# Patient Record
Sex: Female | Born: 1995 | Race: White | Hispanic: No | Marital: Single | Smoking: Current every day smoker
Health system: Southern US, Community
[De-identification: ages and names within clinical notes are randomized; demographics above are authoritative.]

## PROBLEM LIST (undated history)

## (undated) DIAGNOSIS — J45909 Unspecified asthma, uncomplicated: Secondary | ICD-10-CM

## (undated) DIAGNOSIS — B009 Herpesviral infection, unspecified: Secondary | ICD-10-CM

## (undated) HISTORY — DX: Herpesviral infection, unspecified: B00.9

---
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
## 2013-09-28 ENCOUNTER — Emergency Department: Payer: Self-pay | Admitting: Emergency Medicine

## 2013-09-28 LAB — URINALYSIS, COMPLETE
Bilirubin,UR: NEGATIVE
Blood: NEGATIVE
GLUCOSE, UR: NEGATIVE mg/dL (ref 0–75)
Nitrite: NEGATIVE
PH: 6 (ref 4.5–8.0)
Protein: NEGATIVE
Specific Gravity: 1.023 (ref 1.003–1.030)
Squamous Epithelial: 17
WBC UR: 4 /HPF (ref 0–5)

## 2013-09-28 LAB — COMPREHENSIVE METABOLIC PANEL
ALBUMIN: 4.1 g/dL (ref 3.4–5.0)
ALK PHOS: 62 U/L
Anion Gap: 4 — ABNORMAL LOW (ref 7–16)
BUN: 9 mg/dL (ref 7–18)
Bilirubin,Total: 0.6 mg/dL (ref 0.2–1.0)
CREATININE: 0.73 mg/dL (ref 0.60–1.30)
Calcium, Total: 9.5 mg/dL (ref 8.5–10.1)
Chloride: 106 mmol/L (ref 98–107)
Co2: 27 mmol/L (ref 21–32)
EGFR (African American): >60
Glucose: 92 mg/dL (ref 65–99)
OSMOLALITY: 272 (ref 275–301)
Potassium: 3.7 mmol/L (ref 3.5–5.1)
SGOT(AST): 8 U/L — ABNORMAL LOW (ref 15–37)
SGPT (ALT): 12 U/L — ABNORMAL LOW
Sodium: 137 mmol/L (ref 136–145)
TOTAL PROTEIN: 7.2 g/dL (ref 6.4–8.2)

## 2013-09-28 LAB — CBC WITH DIFFERENTIAL/PLATELET
Basophil #: 0.1 10*3/uL (ref 0.0–0.1)
Basophil %: 1.2 %
EOS ABS: 0.9 10*3/uL — AB (ref 0.0–0.7)
Eosinophil %: 10.7 %
HCT: 41.6 % (ref 35.0–47.0)
HGB: 13.5 g/dL (ref 12.0–16.0)
Lymphocyte #: 3 10*3/uL (ref 1.0–3.6)
Lymphocyte %: 35.4 %
MCH: 29.4 pg (ref 26.0–34.0)
MCHC: 32.5 g/dL (ref 32.0–36.0)
MCV: 91 fL (ref 80–100)
MONOS PCT: 6.9 %
Monocyte #: 0.6 x10 3/mm (ref 0.2–0.9)
Neutrophil #: 3.9 10*3/uL (ref 1.4–6.5)
Neutrophil %: 45.8 %
PLATELETS: 226 10*3/uL (ref 150–440)
RBC: 4.59 10*6/uL (ref 3.80–5.20)
RDW: 14.1 % (ref 11.5–14.5)
WBC: 8.5 10*3/uL (ref 3.6–11.0)

## 2013-09-28 LAB — LIPASE, BLOOD: LIPASE: 78 U/L (ref 73–393)

## 2013-09-29 ENCOUNTER — Encounter (HOSPITAL_COMMUNITY): Payer: Self-pay | Admitting: Emergency Medicine

## 2013-09-29 ENCOUNTER — Emergency Department (HOSPITAL_COMMUNITY)
Admission: EM | Admit: 2013-09-29 | Discharge: 2013-09-30 | Disposition: A | Discharge status: Home / Self Care | Payer: Self-pay | Attending: Emergency Medicine | Admitting: Emergency Medicine

## 2013-09-29 DIAGNOSIS — Z3202 Encounter for pregnancy test, result negative: Secondary | ICD-10-CM | POA: Insufficient documentation

## 2013-09-29 DIAGNOSIS — R1013 Epigastric pain: Secondary | ICD-10-CM | POA: Insufficient documentation

## 2013-09-29 DIAGNOSIS — R630 Anorexia: Secondary | ICD-10-CM | POA: Insufficient documentation

## 2013-09-29 LAB — CBC WITH DIFFERENTIAL/PLATELET
BASOS ABS: 0.1 10*3/uL (ref 0.0–0.1)
Basophils Relative: 1 % (ref 0–1)
Eosinophils Absolute: 0.7 10*3/uL (ref 0.0–0.7)
Eosinophils Relative: 6 % — ABNORMAL HIGH (ref 0–5)
HCT: 42.5 % (ref 36.0–46.0)
Hemoglobin: 14.1 g/dL (ref 12.0–15.0)
LYMPHS ABS: 4.2 10*3/uL — AB (ref 0.7–4.0)
LYMPHS PCT: 40 % (ref 12–46)
MCH: 29.2 pg (ref 26.0–34.0)
MCHC: 33.2 g/dL (ref 30.0–36.0)
MCV: 88 fL (ref 78.0–100.0)
Monocytes Absolute: 0.9 10*3/uL (ref 0.1–1.0)
Monocytes Relative: 9 % (ref 3–12)
NEUTROS ABS: 4.7 10*3/uL (ref 1.7–7.7)
NEUTROS PCT: 44 % (ref 43–77)
PLATELETS: 269 10*3/uL (ref 150–400)
RBC: 4.83 MIL/uL (ref 3.87–5.11)
RDW: 13.4 % (ref 11.5–15.5)
WBC: 10.5 10*3/uL (ref 4.0–10.5)

## 2013-09-29 LAB — URINALYSIS, ROUTINE W REFLEX MICROSCOPIC
BILIRUBIN URINE: NEGATIVE
Glucose, UA: NEGATIVE mg/dL
Hgb urine dipstick: NEGATIVE
KETONES UR: 15 mg/dL — AB
Nitrite: NEGATIVE
PH: 6 (ref 5.0–8.0)
Protein, ur: NEGATIVE mg/dL
SPECIFIC GRAVITY, URINE: 1.028 (ref 1.005–1.030)
Urobilinogen, UA: 0.2 mg/dL (ref 0.0–1.0)

## 2013-09-29 LAB — URINE MICROSCOPIC-ADD ON

## 2013-09-29 LAB — COMPREHENSIVE METABOLIC PANEL
ALK PHOS: 68 U/L (ref 39–117)
ALT: 8 U/L (ref 0–35)
AST: 14 U/L (ref 0–37)
Albumin: 4.8 g/dL (ref 3.5–5.2)
Anion gap: 15 (ref 5–15)
BUN: 7 mg/dL (ref 6–23)
CHLORIDE: 100 meq/L (ref 96–112)
CO2: 24 meq/L (ref 19–32)
Calcium: 9.9 mg/dL (ref 8.4–10.5)
Creatinine, Ser: 0.6 mg/dL (ref 0.50–1.10)
GLUCOSE: 94 mg/dL (ref 70–99)
POTASSIUM: 4.4 meq/L (ref 3.7–5.3)
Sodium: 139 mEq/L (ref 137–147)
Total Bilirubin: 0.2 mg/dL — ABNORMAL LOW (ref 0.3–1.2)
Total Protein: 7.6 g/dL (ref 6.0–8.3)

## 2013-09-29 LAB — POC URINE PREG, ED: Preg Test, Ur: NEGATIVE

## 2013-09-29 LAB — LIPASE, BLOOD: Lipase: 21 U/L (ref 11–59)

## 2013-09-29 MED ORDER — GI COCKTAIL ~~LOC~~
30.0000 mL | Freq: Once | ORAL | Status: AC
  Administered 2013-09-30: 30 mL via ORAL
  Filled 2013-09-29: qty 30

## 2013-09-29 MED ORDER — HYDROCODONE-ACETAMINOPHEN 5-325 MG PO TABS
1.0000 | ORAL_TABLET | Freq: Once | ORAL | Status: AC
  Administered 2013-09-30: 1 via ORAL
  Filled 2013-09-29: qty 1

## 2013-09-29 NOTE — ED Provider Notes (Addendum)
CSN: [id number]     Arrival date & time 09/29/13  2106 History   First MD Initiated Contact with Patient 09/29/13 2306     Chief Complaint  Patient presents with  . Abdominal Pain     (Consider location/radiation/quality/duration/timing/severity/associated sxs/prior Treatment) Patient is a 18 y.o. female presenting with abdominal pain. The history is provided by the patient.  Abdominal Pain Pain location:  Epigastric Pain quality: aching   Pain radiates to:  Does not radiate Pain severity:  Severe Onset quality:  Gradual Duration:  2 days Timing:  Constant Progression:  Unchanged Chronicity:  New Context: not alcohol use, not eating, not previous surgeries, not retching, not suspicious food intake and not trauma   Relieved by:  OTC medications Associated symptoms: anorexia   Associated symptoms: no chest pain, no constipation, no cough, no diarrhea, no hematemesis, no hematochezia, no hematuria, no nausea, no shortness of breath and no vomiting     History reviewed. No pertinent past medical history. History reviewed. No pertinent past surgical history. History reviewed. No pertinent family history. History  Substance Use Topics  . Smoking status: Never Smoker   . Smokeless tobacco: Not on file  . Alcohol Use: No   OB History   Grav Para Term Preterm Abortions TAB SAB Ect Mult Living       0   0  1     Review of Systems  Constitutional: Negative for activity change.  HENT: Negative for facial swelling.   Respiratory: Negative for cough, shortness of breath and wheezing.   Cardiovascular: Negative for chest pain.  Gastrointestinal: Positive for abdominal pain and anorexia. Negative for nausea, vomiting, diarrhea, constipation, blood in stool, hematochezia, abdominal distention and hematemesis.  Genitourinary: Negative for hematuria and difficulty urinating.  Musculoskeletal: Negative for neck pain.  Skin: Negative for color change.  Neurological: Negative for speech  difficulty.  Hematological: Does not bruise/bleed easily.  Psychiatric/Behavioral: Negative for confusion.      Allergies  Review of patient's allergies indicates no known allergies.  Home Medications   Prior to Admission medications   Not on File   BP 121/81  Pulse 65  Temp(Src) 98.3 F (36.8 C) (Oral)  Resp 16  Ht 5\' 5"  (1.651 m)  Wt 105 lb (47.628 kg)  BMI 17.47 kg/m2  SpO2 95%  LMP 09/14/2013 Physical Exam  Nursing note and vitals reviewed. Constitutional: She is oriented to person, place, and time. She appears well-developed and well-nourished.  HENT:  Head: Normocephalic and atraumatic.  Eyes: EOM are normal. Pupils are equal, round, and reactive to light.  Neck: Neck supple.  Cardiovascular: Normal rate, regular rhythm, normal heart sounds and intact distal pulses.   No murmur heard. Pulmonary/Chest: Effort normal. No respiratory distress.  Abdominal: Soft. She exhibits no distension. There is tenderness. There is no rebound and no guarding.  Epigastric tenderness, palpable aorta in a very thin patient.  Neurological: She is alert and oriented to person, place, and time.  Skin: Skin is warm and dry.    ED Course  Procedures (including critical care time) Labs Review Labs Reviewed  CBC WITH DIFFERENTIAL - Abnormal; Notable for the following:    Lymphs Abs 4.2 (*)    Eosinophils Relative 6 (*)    All other components within normal limits  COMPREHENSIVE METABOLIC PANEL - Abnormal; Notable for the following:    Total Bilirubin 0.2 (*)    All other components within normal limits  URINALYSIS, ROUTINE W REFLEX MICROSCOPIC - Abnormal; Notable for  the following:    APPearance CLOUDY (*)    Ketones, ur 15 (*)    Leukocytes, UA MODERATE (*)    All other components within normal limits  LIPASE, BLOOD  URINE MICROSCOPIC-ADD ON  POC URINE PREG, ED    Imaging Review No results found.   EKG Interpretation None      MDM   Final diagnoses:  None    Pt  comes in with cc of abd pain. Abd pain is epigastric, non radiating. Pain is "achy, severe."  No drug use, no affects of oral intake, pt has some anorexia. Seen at Urgent care, and was discharged after she had some antacids given. Pt's pain is non radiating, and her pulses are 2+ and equal for all 4. Labs WNL.  US abdomen ordered, which is neg, we will d/c with GI f/u. Dont think there is any aortic etiology. Upreg is neg. Pain is epigastric and reproducible with palpation, making GU pathologies less likely, vitals are stable.  Derwood Kaplan, MD 09/30/13 0001  3:12 AM Normal [place]. Normal labs. PPI prescribed. Will d.c. GI f/u advised for possible need for EGD, or hpylori screen.  Derwood Kaplan, MD 09/30/13 [phone]

## 2013-09-29 NOTE — ED Notes (Signed)
Pt reports central abdominal "ache" x 2 days. Denies N/V/D. Denies vaginal/urinary symptoms. [facility] and was given antacid with no relief. Pt in NAD.

## 2013-09-29 NOTE — ED Notes (Signed)
The pt is c/o upper abd pain for 2 days with no n v or diarrhea.  lmp July 31th no previous history.  No distress

## 2013-09-30 ENCOUNTER — Emergency Department (HOSPITAL_COMMUNITY): Payer: Self-pay

## 2013-09-30 LAB — I-STAT CG4 LACTIC ACID, ED: Lactic Acid, Venous: 0.94 mmol/L (ref 0.5–2.2)

## 2013-09-30 MED ORDER — TRAMADOL HCL 50 MG PO TABS: 50.0000 mg | ORAL_TABLET | Freq: Four times a day (QID) | ORAL | Status: DC | PRN

## 2013-09-30 MED ORDER — OMEPRAZOLE 20 MG PO CPDR: 20.0000 mg | DELAYED_RELEASE_CAPSULE | Freq: Every day | ORAL | Status: DC

## 2013-09-30 NOTE — ED Notes (Signed)
The pt re[ports that her pain was better but has started to return

## 2013-09-30 NOTE — Discharge Instructions (Signed)
We saw you in the ER for the abdominal pain. All of our results are normal, including all labs and imaging. Kidney function is fine as well. We are not sure what is causing your abdominal pain, and recommend that you see GI doctor for further evaluation. If your symptoms get worse, return to the ER. Take the pain meds as prescribed.   Abdominal Pain Many things can cause abdominal pain. Usually, abdominal pain is not caused by a disease and will improve without treatment. It can often be observed and treated at home. Your health care provider will do a physical exam and possibly order blood tests and X-rays to help determine the seriousness of your pain. However, in many cases, more time must pass before a clear cause of the pain can be found. Before that point, your health care provider may not know if you need more testing or further treatment. HOME CARE INSTRUCTIONS  Monitor your abdominal pain for any changes. The following actions may help to alleviate any discomfort you are experiencing:  Only take over-the-counter or prescription medicines as directed by your health care provider.  Do not take laxatives unless directed to do so by your health care provider.  Try a clear liquid diet (broth, tea, or water) as directed by your health care provider. Slowly move to a bland diet as tolerated. SEEK MEDICAL CARE IF:  You have unexplained abdominal pain.  You have abdominal pain associated with nausea or diarrhea.  You have pain when you urinate or have a bowel movement.  You experience abdominal pain that wakes you in the night.  You have abdominal pain that is worsened or improved by eating food.  You have abdominal pain that is worsened with eating fatty foods.  You have a fever. SEEK IMMEDIATE MEDICAL CARE IF:   Your pain does not go away within 2 hours.  You keep throwing up (vomiting).  Your pain is felt only in portions of the abdomen, such as the right side or the left  lower portion of the abdomen.  You pass bloody or black tarry stools. MAKE SURE YOU:  Understand these instructions.   Will watch your condition.   Will get help right away if you are not doing well or get worse.  Document Released: 11/11/2004 Document Revised: 02/06/2013 Document Reviewed: 10/11/2012 [facility] Patient Information 2015 [place], [place]. This information is not intended to replace advice given to you by your health care provider. Make sure you discuss any questions you have with your health care provider.

## 2013-09-30 NOTE — ED Notes (Signed)
Pt to u/s

## 2014-03-06 ENCOUNTER — Emergency Department: Payer: Self-pay | Admitting: Emergency Medicine

## 2014-12-08 IMAGING — US US ABDOMEN COMPLETE
1 series · 14 of 25 positions shown · non-contrast
Comparison: None.

CLINICAL DATA: Mid abdominal pain.

EXAM:
ULTRASOUND ABDOMEN COMPLETE

[Series 1: us abdomen complete · 0.24mm/px · 14 of 72 slices shown]
[im 1/72]
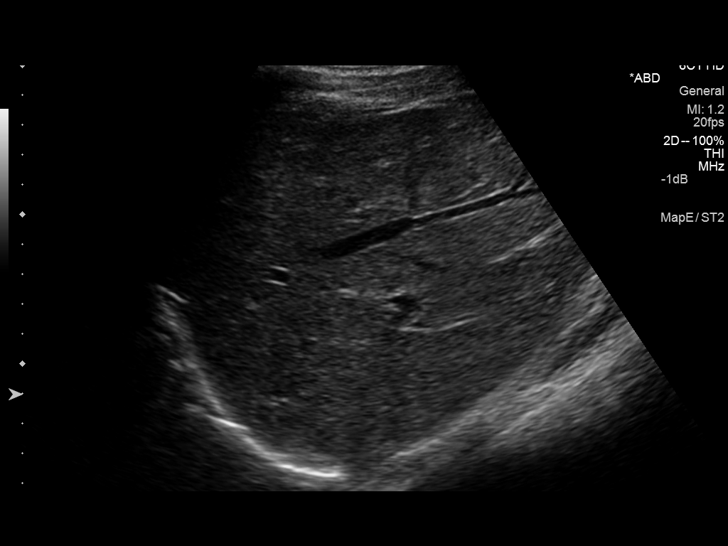
[im 6/72]
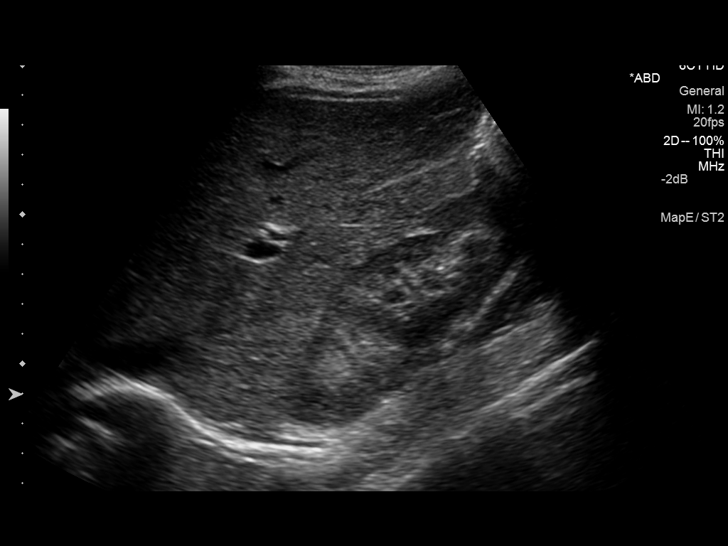
[im 12/72]
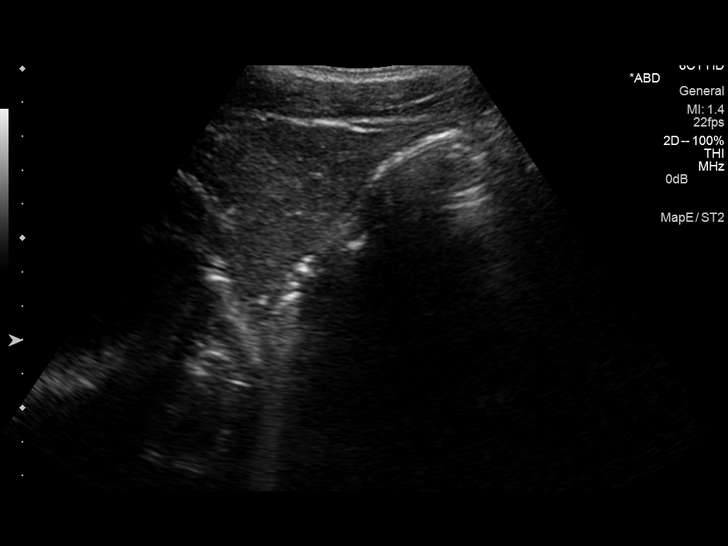
[im 18/72]
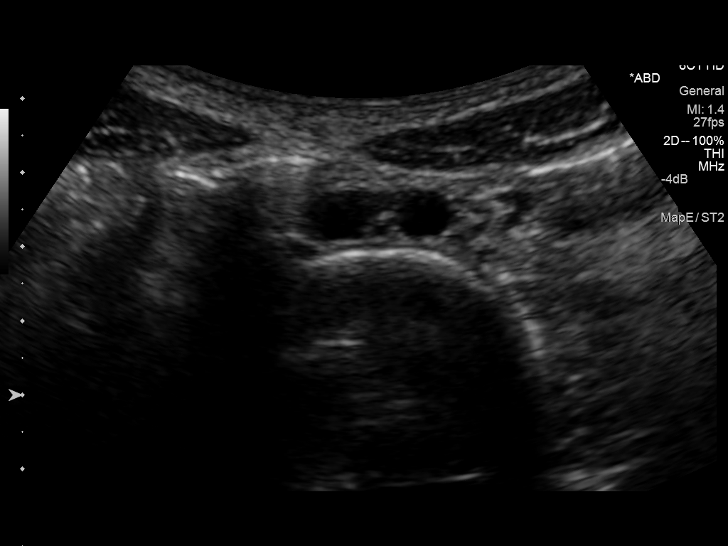
[im 24/72]
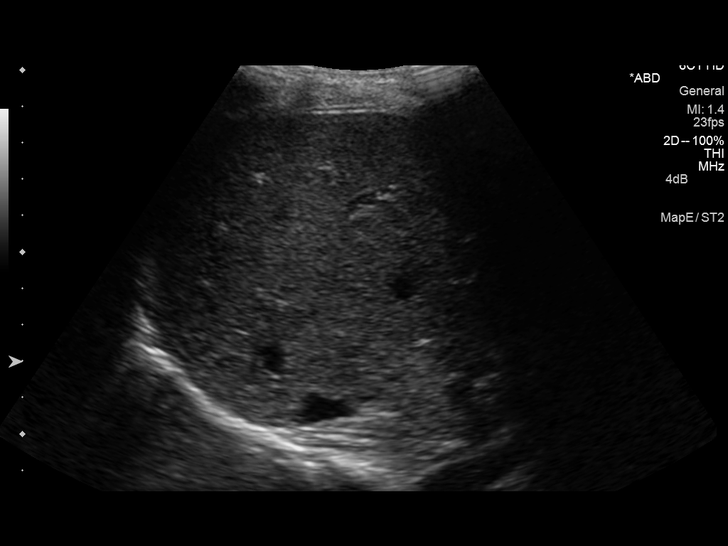
[im 27/72]
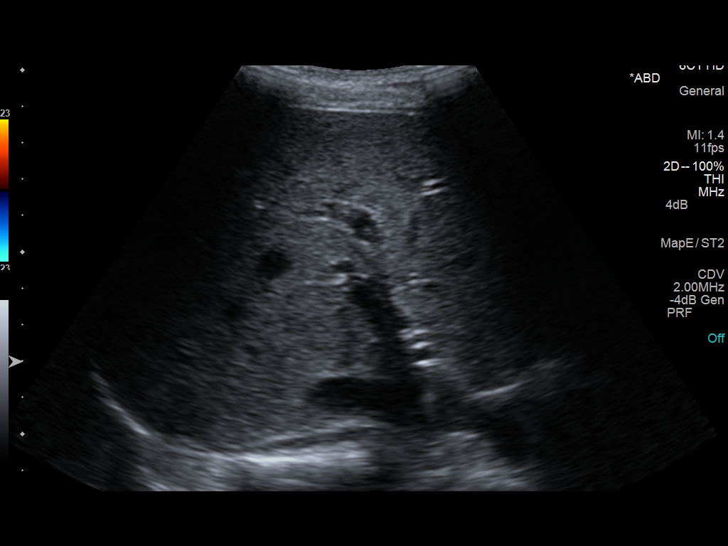
[im 33/72]
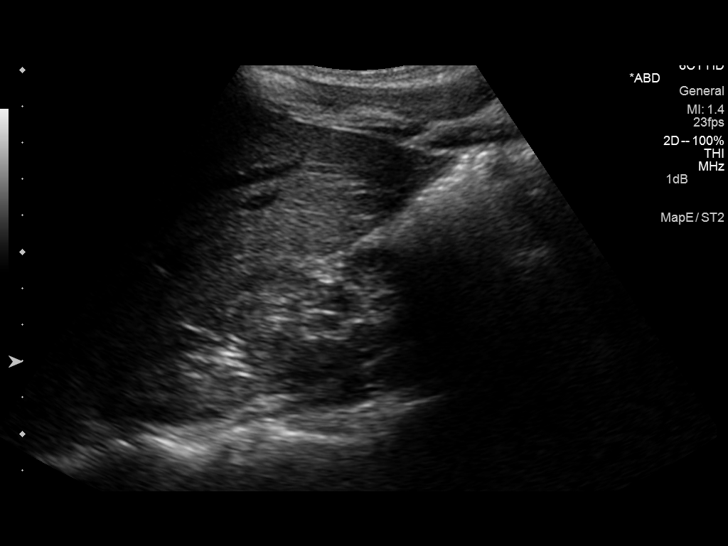
[im 39/72]
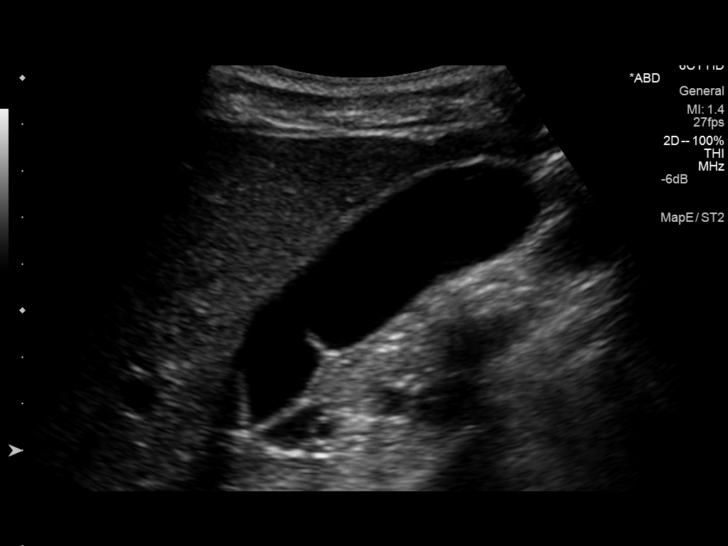
[im 45/72]
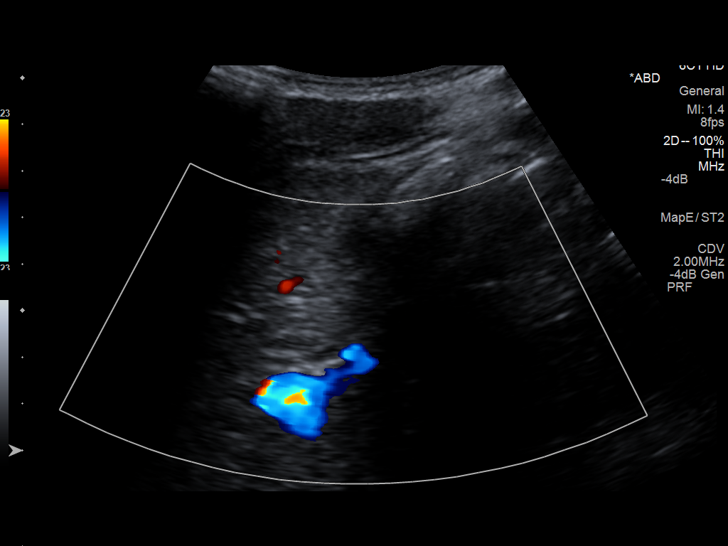
[im 48/72]
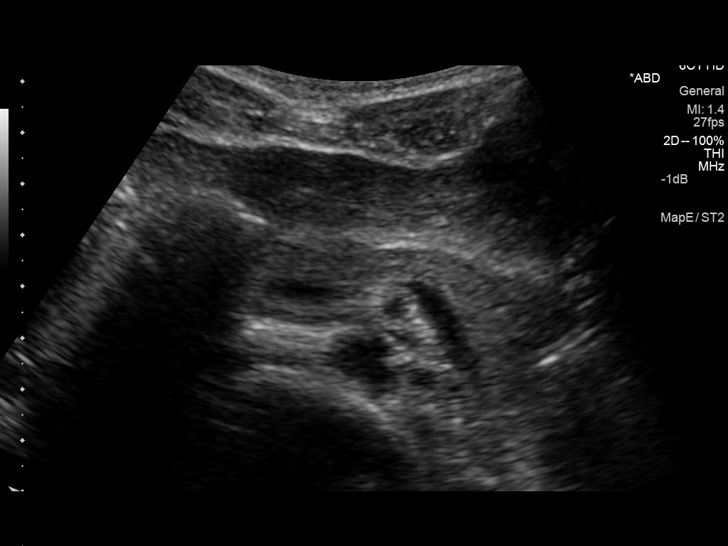
[im 54/72]
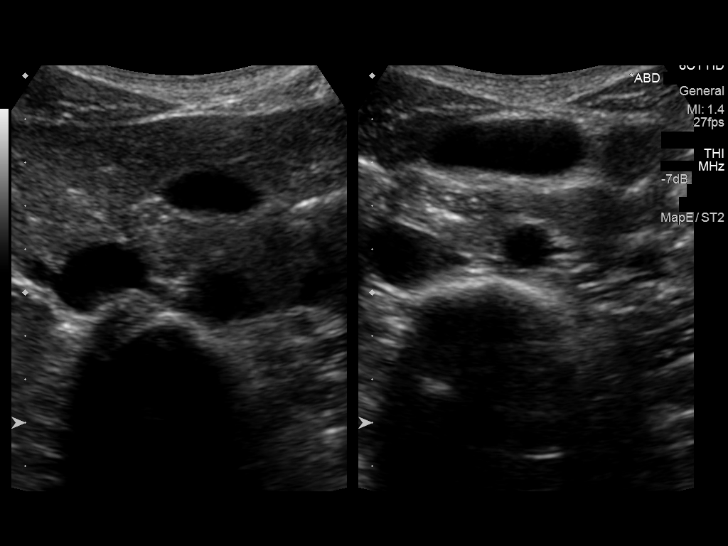
[im 60/72]
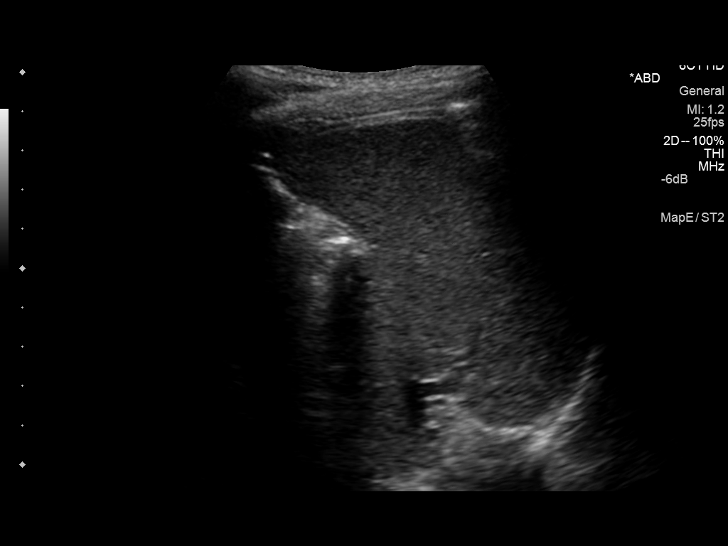
[im 66/72]
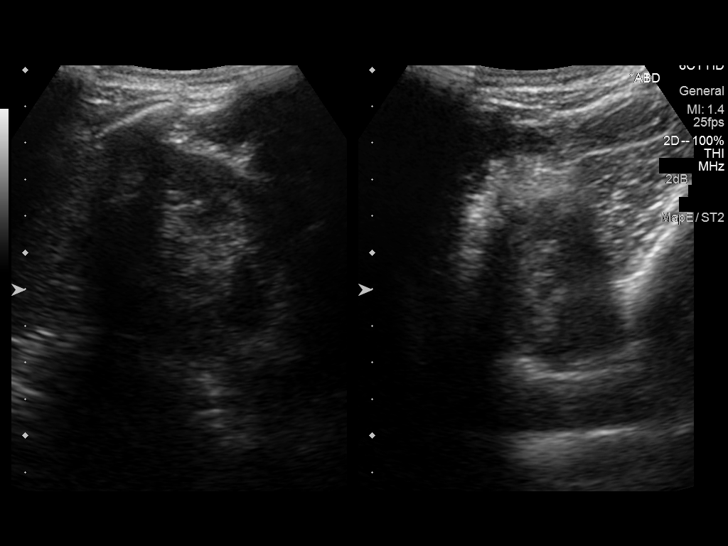
[im 72/72]
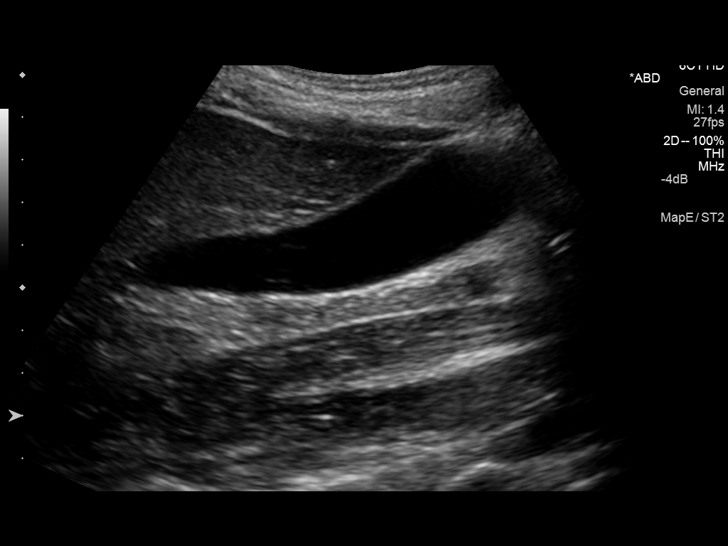

[14 of 25 positions shown; findings below may reference images not displayed]

FINDINGS: Gallbladder:

No gallstones or wall thickening visualized. No sonographic Murphy
sign noted.

Common bile duct:

Diameter: 0.2 cm, within normal limits in caliber.

Liver:

No focal lesion identified. Within normal limits in parenchymal
echogenicity.

IVC:

No abnormality visualized.

Pancreas:

Visualized portion unremarkable.

Spleen:

Size and appearance within normal limits.

Right Kidney:

Length: 11.9 cm. Echogenicity within normal limits. No mass or
hydronephrosis visualized.

Left Kidney:

Length: 12.6 cm. Echogenicity within normal limits. No mass or
hydronephrosis visualized.

Abdominal aorta:

No aneurysm visualized.

Other findings:

None.
IMPRESSION: Unremarkable abdominal ultrasound.

## 2019-03-27 ENCOUNTER — Emergency Department: Payer: Medicaid Other

## 2019-03-27 ENCOUNTER — Encounter: Payer: Self-pay | Admitting: Emergency Medicine

## 2019-03-27 ENCOUNTER — Emergency Department
Admission: EM | Admit: 2019-03-27 | Discharge: 2019-03-27 | Disposition: A | Discharge status: Home / Self Care | Payer: Medicaid Other | Attending: Emergency Medicine | Admitting: Emergency Medicine

## 2019-03-27 ENCOUNTER — Other Ambulatory Visit: Payer: Self-pay

## 2019-03-27 DIAGNOSIS — Z20822 Contact with and (suspected) exposure to covid-19: Secondary | ICD-10-CM | POA: Insufficient documentation

## 2019-03-27 DIAGNOSIS — R05 Cough: Secondary | ICD-10-CM | POA: Diagnosis present

## 2019-03-27 DIAGNOSIS — J209 Acute bronchitis, unspecified: Secondary | ICD-10-CM | POA: Insufficient documentation

## 2019-03-27 LAB — POC SARS CORONAVIRUS 2 AG: SARS Coronavirus 2 Ag: NEGATIVE

## 2019-03-27 MED ORDER — AZITHROMYCIN 250 MG PO TABS: ORAL_TABLET | ORAL | 0 refills | Status: DC

## 2019-03-27 MED ORDER — IPRATROPIUM-ALBUTEROL 0.5-2.5 (3) MG/3ML IN SOLN
3.0000 mL | Freq: Once | RESPIRATORY_TRACT | Status: AC
  Administered 2019-03-27: 18:00:00 3 mL via RESPIRATORY_TRACT
  Filled 2019-03-27: qty 3

## 2019-03-27 MED ORDER — AZITHROMYCIN 500 MG PO TABS
500.0000 mg | ORAL_TABLET | Freq: Once | ORAL | Status: AC
  Administered 2019-03-27: 20:00:00 500 mg via ORAL
  Filled 2019-03-27: qty 1

## 2019-03-27 MED ORDER — ALBUTEROL SULFATE HFA 108 (90 BASE) MCG/ACT IN AERS: 2.0000 | INHALATION_SPRAY | Freq: Four times a day (QID) | RESPIRATORY_TRACT | 0 refills | Status: DC | PRN

## 2019-03-27 MED ORDER — BENZONATATE 100 MG PO CAPS: 100.0000 mg | ORAL_CAPSULE | Freq: Three times a day (TID) | ORAL | 0 refills | Status: DC | PRN

## 2019-03-27 MED ORDER — PREDNISONE 10 MG PO TABS: ORAL_TABLET | ORAL | 0 refills | Status: DC

## 2019-03-27 MED ORDER — IPRATROPIUM-ALBUTEROL 0.5-2.5 (3) MG/3ML IN SOLN
3.0000 mL | Freq: Once | RESPIRATORY_TRACT | Status: AC
  Administered 2019-03-27: 19:00:00 3 mL via RESPIRATORY_TRACT
  Filled 2019-03-27: qty 3

## 2019-03-27 MED ORDER — DEXAMETHASONE 10 MG/ML FOR PEDIATRIC ORAL USE
10.0000 mg | Freq: Once | INTRAMUSCULAR | Status: AC
  Administered 2019-03-27: 19:00:00 10 mg via ORAL
  Filled 2019-03-27: qty 1

## 2019-03-27 NOTE — ED Provider Notes (Signed)
[facility] Emergency Department Provider Note  ____________________________________________  Time seen: Approximately 7:52 PM  I have reviewed the triage vital signs and the nursing notes.   HISTORY  Chief Complaint Cough and Shortness of Breath    HPI Tamara Walters is a 24 y.o. female that presents to the emergency department for evaluation of nonproductive cough, shortness of breath with exertion and wheezing for 3-4 days.  Patient went to urgent care prior to coming to the emergency department and was referred here for symptoms.  Patient denies sick contacts.  Patient does not smoke cigarettes.  No history of asthma or allergies.  She has been giving an albuterol inhaler in the past for URI.  No fever, nasal congestion, sore throat, chest pain, vomiting, abdominal pain.   History reviewed. No pertinent past medical history.  There are no problems to display for this patient.   History reviewed. No pertinent surgical history.  Prior to Admission medications   Medication Sig Start Date End Date Taking? Authorizing Provider  albuterol (VENTOLIN HFA) 108 (90 Base) MCG/ACT inhaler Inhale 2 puffs into the lungs every 6 (six) hours as needed for wheezing or shortness of breath. 03/27/19   Enid Derry, PA-C  azithromycin (ZITHROMAX Z-PAK) 250 MG tablet Take 2 tablets (500 mg) on  Day 1,  followed by 1 tablet (250 mg) once daily on Days 2 through 5. 03/27/19   Enid Derry, PA-C  benzonatate (TESSALON PERLES) 100 MG capsule Take 1 capsule (100 mg total) by mouth 3 (three) times daily as needed. 03/27/19 03/26/20  Enid Derry, PA-C  omeprazole (PRILOSEC) 20 MG capsule Take 1 capsule (20 mg total) by mouth daily. 09/30/13   Derwood Kaplan, MD  predniSONE (DELTASONE) 10 MG tablet Take 6 tablets on day 1, take 5 tablets on day 2, take 4 tablets on day 3, take 3 tablets on day 4, take 2 tablets on day 5, take 1 tablet on day 6 03/27/19   Enid Derry, PA-C  traMADol  (ULTRAM) 50 MG tablet Take 1 tablet (50 mg total) by mouth every 6 (six) hours as needed. 09/30/13   Derwood Kaplan, MD    Allergies Acetaminophen  No family history on file.  Social History Social History   Tobacco Use  . Smoking status: Never Smoker  Substance Use Topics  . Alcohol use: Yes    Comment: occasional  . Drug use: Yes    Types: Marijuana     Review of Systems  Constitutional: No fever/chills Eyes: No visual changes. No discharge. ENT: Negative for congestion and rhinorrhea. Cardiovascular: No chest pain. Respiratory: Positive for cough. No SOB. Gastrointestinal: No abdominal pain.  No nausea, no vomiting.  No diarrhea.  No constipation. Musculoskeletal: Negative for musculoskeletal pain. Skin: Negative for rash, abrasions, lacerations, ecchymosis. Neurological: Negative for headaches.   ____________________________________________   PHYSICAL EXAM:  VITAL SIGNS: ED Triage Vitals [03/27/19 1649]  Enc Vitals Group     BP 124/86     Pulse Rate 74     Resp 16     Temp 98.7 F (37.1 C)     Temp Source Oral     SpO2 97 %     Weight 95 lb (43.1 kg)     Height 5\' 5"  (1.651 m)     Head Circumference      Peak Flow      Pain Score 0     Pain Loc      Pain Edu?  Excl. in GC?      Constitutional: Alert and oriented. Well appearing and in no acute distress. Eyes: Conjunctivae are normal. PERRL. EOMI. No discharge. Head: Atraumatic. ENT: No frontal and maxillary sinus tenderness.      Ears: Tympanic membranes pearly gray with good landmarks. No discharge.      Nose: No congestion/rhinnorhea.      Mouth/Throat: Mucous membranes are moist. Oropharynx non-erythematous. Tonsils not enlarged. No exudates. Uvula midline. Neck: No stridor.   Hematological/Lymphatic/Immunilogical: No cervical lymphadenopathy. Cardiovascular: Normal rate, regular rhythm.  Good peripheral circulation. Respiratory: Normal respiratory effort without tachypnea or  retractions. Scattered wheezes. Good air entry to the bases with no decreased or absent breath sounds. Gastrointestinal: Bowel sounds 4 quadrants. Soft and nontender to palpation. No guarding or rigidity. No palpable masses. No distention. Musculoskeletal: Full range of motion to all extremities. No gross deformities appreciated. Neurologic:  Normal speech and language. No gross focal neurologic deficits are appreciated.  Skin:  Skin is warm, dry and intact. No rash noted. Psychiatric: Mood and affect are normal. Speech and behavior are normal. Patient exhibits appropriate insight and judgement.   ____________________________________________   LABS (all labs ordered are listed, but only abnormal results are displayed)  Labs Reviewed  SARS CORONAVIRUS 2 (TAT 6-24 HRS)  POC SARS CORONAVIRUS 2 AG -  ED  POC SARS CORONAVIRUS 2 AG   ____________________________________________  EKG   ____________________________________________  RADIOLOGY Lexine Baton, personally viewed and evaluated these images (plain radiographs) as part of my medical decision making, as well as reviewing the written report by the radiologist.  DG Chest Portable 1 View  Result Date: 03/27/2019 CLINICAL DATA:  Dry cough and wheezing. EXAM: PORTABLE CHEST 1 VIEW COMPARISON:  None. FINDINGS: The cardiac silhouette, mediastinal and hilar contours are normal. Mild hyperinflation is noted. No infiltrates, edema or effusions. No pulmonary lesions. The bony thorax is intact. IMPRESSION: Mild hyperinflation but no infiltrates or effusions. Electronically Signed   By: Rudie Meyer M.D.   On: 03/27/2019 17:31    ____________________________________________    PROCEDURES  Procedure(s) performed:    Procedures    Medications  ipratropium-albuterol (DUONEB) 0.5-2.5 (3) MG/3ML nebulizer solution 3 mL (3 mLs Nebulization Given 03/27/19 1738)  dexamethasone (DECADRON) 10 MG/ML injection for Pediatric ORAL use 10 mg  (10 mg Oral Given 03/27/19 1904)  ipratropium-albuterol (DUONEB) 0.5-2.5 (3) MG/3ML nebulizer solution 3 mL (3 mLs Nebulization Given 03/27/19 1923)  azithromycin (ZITHROMAX) tablet 500 mg (500 mg Oral Given 03/27/19 1955)     ____________________________________________   INITIAL IMPRESSION / ASSESSMENT AND PLAN / ED COURSE  Pertinent labs & imaging results that were available during my care of the patient were reviewed by me and considered in my medical decision making (see chart for details).  Review of the  [place]RS was performed in accordance of the [facility] prior to dispensing any controlled drugs.   Patient's diagnosis is consistent with bronchitis. Vital signs and exam are reassuring. Chest xray negative for infiltrate.  Patient received 2 DuoNeb's in the emergency department, a dose of Decadron and a dose of azithromycin.  Patient states that she no longer feels like she is wheezing following breathing treatments and steroids.  She feels much improved.  POC Covid is negative.  PCR Covid is pending.  Patient appears well and is staying well hydrated. Patient feels comfortable going home. Patient will be discharged home with prescriptions for azithromycin, prednisone, albuterol inhaler, tessalon perles Patient is to follow up with PCP as needed  or otherwise directed. Patient is given ED precautions to return to the ED for any worsening or new symptoms.   Tamara Walters was evaluated in Emergency Department on 03/27/2019 for the symptoms described in the history of present illness. She was evaluated in the context of the global COVID-19 pandemic, which necessitated consideration that the patient might be at risk for infection with the SARS-CoV-2 virus that causes COVID-19. Institutional protocols and algorithms that pertain to the evaluation of patients at risk for COVID-19 are in a state of rapid change based on information released by regulatory bodies including the CDC and federal and state  organizations. These policies and algorithms were followed during the patient's care in the ED.  ____________________________________________  FINAL CLINICAL IMPRESSION(S) / ED DIAGNOSES  Final diagnoses:  Acute bronchitis, unspecified organism      NEW MEDICATIONS STARTED DURING THIS VISIT:  ED Discharge Orders         Ordered    azithromycin (ZITHROMAX Z-PAK) 250 MG tablet     03/27/19 1956    albuterol (VENTOLIN HFA) 108 (90 Base) MCG/ACT inhaler  Every 6 hours PRN     03/27/19 1956    predniSONE (DELTASONE) 10 MG tablet     03/27/19 1956    benzonatate (TESSALON PERLES) 100 MG capsule  3 times daily PRN     03/27/19 1956              This chart was dictated using voice recognition software/Dragon. Despite best efforts to proofread, errors can occur which can change the meaning. Any change was purely unintentional.    Laban Emperor, PA-C 03/27/19 2254    Earleen Newport, MD 04/03/19 1309

## 2019-03-27 NOTE — ED Triage Notes (Signed)
Patient reports dry cough for the last few days. States she has SOB with exertion. Denies fever. Denies contact with COVID+ patient.

## 2019-03-28 LAB — SARS CORONAVIRUS 2 (TAT 6-24 HRS): SARS Coronavirus 2: NEGATIVE

## 2019-06-29 ENCOUNTER — Other Ambulatory Visit (HOSPITAL_COMMUNITY)
Admission: RE | Admit: 2019-06-29 | Discharge: 2019-06-29 | Disposition: A | Discharge status: Home / Self Care | Payer: Medicaid Other | Source: Ambulatory Visit | Attending: Advanced Practice Midwife | Admitting: Advanced Practice Midwife

## 2019-06-29 ENCOUNTER — Ambulatory Visit (INDEPENDENT_AMBULATORY_CARE_PROVIDER_SITE_OTHER): Payer: Medicaid Other | Admitting: Advanced Practice Midwife

## 2019-06-29 ENCOUNTER — Encounter: Payer: Self-pay | Admitting: Advanced Practice Midwife

## 2019-06-29 ENCOUNTER — Other Ambulatory Visit: Payer: Self-pay

## 2019-06-29 VITALS — BP 100/60 | Ht 65.0 in | Wt 100.0 lb

## 2019-06-29 DIAGNOSIS — Z01419 Encounter for gynecological examination (general) (routine) without abnormal findings: Secondary | ICD-10-CM | POA: Insufficient documentation

## 2019-06-29 DIAGNOSIS — Z0000 Encounter for general adult medical examination without abnormal findings: Secondary | ICD-10-CM | POA: Diagnosis not present

## 2019-06-29 DIAGNOSIS — Z124 Encounter for screening for malignant neoplasm of cervix: Secondary | ICD-10-CM | POA: Insufficient documentation

## 2019-06-29 DIAGNOSIS — Z113 Encounter for screening for infections with a predominantly sexual mode of transmission: Secondary | ICD-10-CM | POA: Diagnosis present

## 2019-06-29 DIAGNOSIS — B009 Herpesviral infection, unspecified: Secondary | ICD-10-CM

## 2019-06-29 MED ORDER — VALACYCLOVIR HCL 500 MG PO TABS: 500.0000 mg | ORAL_TABLET | Freq: Two times a day (BID) | ORAL | 6 refills | Status: AC

## 2019-06-29 NOTE — Patient Instructions (Addendum)
Coping with Quitting Smoking  Quitting smoking is a physical and mental challenge. You will face cravings, withdrawal symptoms, and temptation. Before quitting, work with your health care provider to make a plan that can help you cope. Preparation can help you quit and keep you from giving in. How can I cope with cravings? Cravings usually last for 5-10 minutes. If you get through it, the craving will pass. Consider taking the following actions to help you cope with cravings:  Keep your mouth busy: ? Chew sugar-free gum. ? Suck on hard candies or a straw. ? Brush your teeth.  Keep your hands and body busy: ? Immediately change to a different activity when you feel a craving. ? Squeeze or play with a ball. ? Do an activity or a hobby, like making bead jewelry, practicing needlepoint, or working with wood. ? Mix up your normal routine. ? Take a short exercise break. Go for a quick walk or run up and down stairs. ? Spend time in public places where smoking is not allowed.  Focus on doing something kind or helpful for someone else.  Call a friend or family member to talk during a craving.  Join a support group.  Call a quit line, such as 1-800-QUIT-NOW.  Talk with your health care provider about medicines that might help you cope with cravings and make quitting easier for you. How can I deal with withdrawal symptoms? Your body may experience negative effects as it tries to get used to not having nicotine in the system. These effects are called withdrawal symptoms. They may include:  Feeling hungrier than normal.  Trouble concentrating.  Irritability.  Trouble sleeping.  Feeling depressed.  Restlessness and agitation.  Craving a cigarette. To manage withdrawal symptoms:  Avoid places, people, and activities that trigger your cravings.  Remember why you want to quit.  Get plenty of sleep.  Avoid coffee and other caffeinated drinks. These may worsen some of your  symptoms. How can I handle social situations? Social situations can be difficult when you are quitting smoking, especially in the first few weeks. To manage this, you can:  Avoid parties, bars, and other social situations where people might be smoking.  Avoid alcohol.  Leave right away if you have the urge to smoke.  Explain to your family and friends that you are quitting smoking. Ask for understanding and support.  Plan activities with friends or family where smoking is not an option. What are some ways I can cope with stress? Wanting to smoke may cause stress, and stress can make you want to smoke. Find ways to manage your stress. Relaxation techniques can help. For example:  Breathe slowly and deeply, in through your nose and out through your mouth.  Listen to soothing, relaxing music.  Talk with a family member or friend about your stress.  Light a candle.  Soak in a bath or take a shower.  Think about a peaceful place. What are some ways I can prevent weight gain? Be aware that many people gain weight after they quit smoking. However, not everyone does. To keep from gaining weight, have a plan in place before you quit and stick to the plan after you quit. Your plan should include:  Having healthy snacks. When you have a craving, it may help to: ? Eat plain popcorn, crunchy carrots, celery, or other cut vegetables. ? Chew sugar-free gum.  Changing how you eat: ? Eat small portion sizes at meals. ? Eat 4-6 small meals   throughout the day instead of 1-2 large meals a day. ? Be mindful when you eat. Do not watch television or do other things that might distract you as you eat.  Exercising regularly: ? Make time to exercise each day. If you do not have time for a long workout, do short bouts of exercise for 5-10 minutes several times a day. ? Do some form of strengthening exercise, like weight lifting, and some form of aerobic exercise, like running or swimming.  Drinking  plenty of water or other low-calorie or no-calorie drinks. Drink 6-8 glasses of water daily, or as much as instructed by your health care provider. Summary  Quitting smoking is a physical and mental challenge. You will face cravings, withdrawal symptoms, and temptation to smoke again. Preparation can help you as you go through these challenges.  You can cope with cravings by keeping your mouth busy (such as by chewing gum), keeping your body and hands busy, and making calls to family, friends, or a helpline for people who want to quit smoking.  You can cope with withdrawal symptoms by avoiding places where people smoke, avoiding drinks with caffeine, and getting plenty of rest.  Ask your health care provider about the different ways to prevent weight gain, avoid stress, and handle social situations. This information is not intended to replace advice given to you by your health care provider. Make sure you discuss any questions you have with your health care provider. Document Revised: 01/14/2017 Document Reviewed: 01/30/2016 Elsevier Patient Education  2020 [employer].   Health Maintenance, Female Adopting a healthy lifestyle and getting preventive care are important in promoting health and wellness. Ask your health care provider about:  The right schedule for you to have regular tests and exams.  Things you can do on your own to prevent diseases and keep yourself healthy. What should I know about diet, weight, and exercise? Eat a healthy diet   Eat a diet that includes plenty of vegetables, fruits, low-fat dairy products, and lean protein.  Do not eat a lot of foods that are high in solid fats, added sugars, or sodium. Maintain a healthy weight Body mass index (BMI) is used to identify weight problems. It estimates body fat based on height and weight. Your health care provider can help determine your BMI and help you achieve or maintain a healthy weight. Get regular exercise Get  regular exercise. This is one of the most important things you can do for your health. Most adults should:  Exercise for at least 150 minutes each week. The exercise should increase your heart rate and make you sweat (moderate-intensity exercise).  Do strengthening exercises at least twice a week. This is in addition to the moderate-intensity exercise.  Spend less time sitting. Even light physical activity can be beneficial. Watch cholesterol and blood lipids Have your blood tested for lipids and cholesterol at 26 years of age, then have this test every 5 years. Have your cholesterol levels checked more often if:  Your lipid or cholesterol levels are high.  You are older than 26 years of age.  You are at high risk for heart disease. What should I know about cancer screening? Depending on your health history and family history, you may need to have cancer screening at various ages. This may include screening for:  Breast cancer.  Cervical cancer.  Colorectal cancer.  Skin cancer.  Lung cancer. What should I know about heart disease, diabetes, and high blood pressure? Blood pressure and  heart disease  High blood pressure causes heart disease and increases the risk of stroke. This is more likely to develop in people who have high blood pressure readings, are of African descent, or are overweight.  Have your blood pressure checked: ? Every 3-5 years if you are 5-65 years of age. ? Every year if you are 61 years old or older. Diabetes Have regular diabetes screenings. This checks your fasting blood sugar level. Have the screening done:  Once every three years after age 16 if you are at a normal weight and have a low risk for diabetes.  More often and at a younger age if you are overweight or have a high risk for diabetes. What should I know about preventing infection? Hepatitis B If you have a higher risk for hepatitis B, you should be screened for this virus. Talk with your  health care provider to find out if you are at risk for hepatitis B infection. Hepatitis C Testing is recommended for:  Everyone born from [birth year] through 1967.  Anyone with known risk factors for hepatitis C. Sexually transmitted infections (STIs)  Get screened for STIs, including gonorrhea and chlamydia, if: ? You are sexually active and are younger than 26 years of age. ? You are older than 26 years of age and your health care provider tells you that you are at risk for this type of infection. ? Your sexual activity has changed since you were last screened, and you are at increased risk for chlamydia or gonorrhea. Ask your health care provider if you are at risk.  Ask your health care provider about whether you are at high risk for HIV. Your health care provider may recommend a prescription medicine to help prevent HIV infection. If you choose to take medicine to prevent HIV, you should first get tested for HIV. You should then be tested every 3 months for as long as you are taking the medicine. Pregnancy  If you are about to stop having your period (premenopausal) and you may become pregnant, seek counseling before you get pregnant.  Take 400 to 800 micrograms (mcg) of folic acid every day if you become pregnant.  Ask for birth control (contraception) if you want to prevent pregnancy. Osteoporosis and menopause Osteoporosis is a disease in which the bones lose minerals and strength with aging. This can result in bone fractures. If you are 24 years old or older, or if you are at risk for osteoporosis and fractures, ask your health care provider if you should:  Be screened for bone loss.  Take a calcium or vitamin D supplement to lower your risk of fractures.  Be given hormone replacement therapy (HRT) to treat symptoms of menopause. Follow these instructions at home: Lifestyle  Do not use any products that contain nicotine or tobacco, such as cigarettes, e-cigarettes, and chewing  tobacco. If you need help quitting, ask your health care provider.  Do not use street drugs.  Do not share needles.  Ask your health care provider for help if you need support or information about quitting drugs. Alcohol use  Do not drink alcohol if: ? Your health care provider tells you not to drink. ? You are pregnant, may be pregnant, or are planning to become pregnant.  If you drink alcohol: ? Limit how much you use to 0-1 drink a day. ? Limit intake if you are breastfeeding.  Be aware of how much alcohol is in your drink. In the [place] one drink equals one  12 oz bottle of beer (355 mL), one 5 oz glass of wine (148 mL), or one 1 oz glass of hard liquor (44 mL). General instructions  Schedule regular health, dental, and eye exams.  Stay current with your vaccines.  Tell your health care provider if: ? You often feel depressed. ? You have ever been abused or do not feel safe at home. Summary  Adopting a healthy lifestyle and getting preventive care are important in promoting health and wellness.  Follow your health care provider's instructions about healthy diet, exercising, and getting tested or screened for diseases.  Follow your health care provider's instructions on monitoring your cholesterol and blood pressure. This information is not intended to replace advice given to you by your health care provider. Make sure you discuss any questions you have with your health care provider. Document Revised: 01/25/2018 Document Reviewed: 01/25/2018 Elsevier Patient Education  2020 [employer].

## 2019-06-29 NOTE — Progress Notes (Signed)
Gynecology Annual Exam  PCP: Patient, No Pcp Per  Chief Complaint:  Chief Complaint  Patient presents with  . Gynecologic Exam    History of Present Illness: Patient is a 24 y.o. G1P1001 presents for annual exam. The patient has complaint today of lumps like a knot and tenderness especially on her right breast. The pain happens at random times and is tender with or without touch and has been on and off for the past several months with more in the past month. She admits sometimes it is associated with her cycle. She does drink caffeine- sometimes 2 starbucks cans of coffee.   She has a history of genital herpes and has rare outbreaks. She has run out of pills from a previous prescription of valtrex and requests new Rx to take episodically.  LMP: Patient's last menstrual period was 06/25/2019. Menarche:9 Average Interval: regular, 28 days Duration of flow: 4-5 days Heavy Menses: no Clots: no Intermenstrual Bleeding: no Postcoital Bleeding: no Dysmenorrhea: no  The patient is sexually active. She currently uses condoms for contraception. She denies dyspareunia.  The patient does perform self breast exams.  There is no notable family history of breast or ovarian cancer in her family.  The patient wears seatbelts: yes.  The patient has regular exercise: she is active at her job at a [employer] and likes to be outside and walking. She eats a healthy diet. She drinks mostly green tea and coffee and does not drink sodas and has limited water intake. She admits adequate sleep.    The patient denies current symptoms of depression.    Review of Systems: Review of Systems  Constitutional: Negative for chills and fever.  HENT: Negative for congestion, ear discharge, ear pain, hearing loss, sinus pain and sore throat.   Eyes: Negative for blurred vision and double vision.  Respiratory: Negative for cough, shortness of breath and wheezing.   Cardiovascular: Negative for chest pain,  palpitations and leg swelling.  Gastrointestinal: Negative for abdominal pain, blood in stool, constipation, diarrhea, heartburn, melena, nausea and vomiting.  Genitourinary: Negative for dysuria, flank pain, frequency, hematuria and urgency.  Musculoskeletal: Negative for back pain, joint pain and myalgias.  Skin: Negative for itching and rash.  Neurological: Negative for dizziness, tingling, tremors, sensory change, speech change, focal weakness, seizures, loss of consciousness, weakness and headaches.  Endo/Heme/Allergies: Negative for environmental allergies. Does not bruise/bleed easily.  Psychiatric/Behavioral: Negative for depression, hallucinations, memory loss, substance abuse and suicidal ideas. The patient is not nervous/anxious and does not have insomnia.   Breast: right breast knot/tenderness  Past Medical History:  There are no problems to display for this patient.   Past Surgical History:  History reviewed. No pertinent surgical history.  Gynecologic History:  Patient's last menstrual period was 06/25/2019. Contraception: condoms Last Pap: First PAP smear today  Obstetric History: G1P1001  Family History:  Family History  Problem Relation Age of Onset  . Cancer Father   . Brain cancer Father   . Kidney cancer Father   . Lung cancer Father     Social History:  Social History   Socioeconomic History  . Marital status: Single    Spouse name: Not on file  . Number of children: Not on file  . Years of education: Not on file  . Highest education level: Not on file  Occupational History  . Not on file  Tobacco Use  . Smoking status: Current Every Day Smoker    Types: E-cigarettes  .  Smokeless tobacco: Never Used  Substance and Sexual Activity  . Alcohol use: Yes    Comment: occasional  . Drug use: Yes    Types: Marijuana  . Sexual activity: Yes    Birth control/protection: None  Other Topics Concern  . Not on file  Social History Narrative  . Not on  file   Social Determinants of Health   Financial Resource Strain:   . Difficulty of Paying Living Expenses:   Food Insecurity:   . Worried About Programme researcher, broadcasting/film/video in the Last Year:   . Barista in the Last Year:   Transportation Needs:   . Freight forwarder (Medical):   Marland Kitchen Lack of Transportation (Non-Medical):   Physical Activity:   . Days of Exercise per Week:   . Minutes of Exercise per Session:   Stress:   . Feeling of Stress :   Social Connections:   . Frequency of Communication with Friends and Family:   . Frequency of Social Gatherings with Friends and Family:   . Attends Religious Services:   . Active Member of Clubs or Organizations:   . Attends Banker Meetings:   Marland Kitchen Marital Status:   Intimate Partner Violence:   . Fear of Current or Ex-Partner:   . Emotionally Abused:   Marland Kitchen Physically Abused:   . Sexually Abused:     Allergies:  Allergies  Allergen Reactions  . Acetaminophen Swelling    Medications: Prior to Admission medications   Medication Sig Start Date End Date Taking? Authorizing Provider  albuterol (VENTOLIN HFA) 108 (90 Base) MCG/ACT inhaler Inhale 2 puffs into the lungs every 6 (six) hours as needed for wheezing or shortness of breath. Patient not taking: Reported on 06/29/2019 03/27/19   Enid Derry, PA-C  azithromycin (ZITHROMAX Z-PAK) 250 MG tablet Take 2 tablets (500 mg) on  Day 1,  followed by 1 tablet (250 mg) once daily on Days 2 through 5. Patient not taking: Reported on 06/29/2019 03/27/19   Enid Derry, PA-C  benzonatate (TESSALON PERLES) 100 MG capsule Take 1 capsule (100 mg total) by mouth 3 (three) times daily as needed. Patient not taking: Reported on 06/29/2019 03/27/19 03/26/20  Enid Derry, PA-C  omeprazole (PRILOSEC) 20 MG capsule Take 1 capsule (20 mg total) by mouth daily. Patient not taking: Reported on 06/29/2019 09/30/13   Derwood Kaplan, MD  predniSONE (DELTASONE) 10 MG tablet Take 6 tablets on day 1, take  5 tablets on day 2, take 4 tablets on day 3, take 3 tablets on day 4, take 2 tablets on day 5, take 1 tablet on day 6 Patient not taking: Reported on 06/29/2019 03/27/19   Enid Derry, PA-C  traMADol (ULTRAM) 50 MG tablet Take 1 tablet (50 mg total) by mouth every 6 (six) hours as needed. Patient not taking: Reported on 06/29/2019 09/30/13   Derwood Kaplan, MD  valACYclovir (VALTREX) 500 MG tablet Take 1 tablet (500 mg total) by mouth 2 (two) times daily for 3 days. 06/29/19 07/02/19  Tresea Mall, CNM    Physical Exam Vitals: Blood pressure 100/60, height 5\' 5"  (1.651 m), weight 100 lb (45.4 kg), last menstrual period 06/25/2019.  General: NAD HEENT: normocephalic, anicteric Thyroid: no enlargement, no palpable nodules Pulmonary: No increased work of breathing, CTAB Cardiovascular: RRR, distal pulses 2+ Breast: Breast slightly asymmetrical- right breast somewhat larger than left, mild tenderness, no palpable nodules or masses, no skin or nipple retraction present, no nipple discharge.  No axillary or  supraclavicular lymphadenopathy. Normal breast exam- fibrocystic breast tissue Abdomen: NABS, soft, non-tender, non-distended.  Umbilicus without lesions.  No hepatomegaly, splenomegaly or masses palpable. No evidence of hernia  Genitourinary:  External: Normal external female genitalia.  Normal urethral meatus, normal Bartholin's and Skene's glands.    Vagina: Normal vaginal mucosa, no evidence of prolapse.    Cervix: Grossly normal in appearance with the exception of there appears to be a healed cervical laceration at 9:30 o'clock, no bleeding, no CMT  Uterus: Non-enlarged, mobile, normal contour.    Adnexa: ovaries non-enlarged, no adnexal masses  Rectal: deferred  Lymphatic: no evidence of inguinal lymphadenopathy Extremities: no edema, erythema, or tenderness Neurologic: Grossly intact Psychiatric: mood appropriate, affect full    Assessment: 24 y.o. G1P1001 routine annual  exam  Plan: Problem List Items Addressed This Visit    None    Visit Diagnoses    Well woman exam with routine gynecological exam    -  Primary   Relevant Orders   Cytology - PAP   Herpes infection       Relevant Medications   valACYclovir (VALTREX) 500 MG tablet   Screen for sexually transmitted diseases       Relevant Orders   Cytology - PAP   Cervical cancer screening       Relevant Orders   Cytology - PAP      1) 4) Gardasil Series discussed and if applicable offered to patient - Patient has not previously completed 3 shot series   2) STI screening  was offered and accepted  3)  ASCCP guidelines and rationale discussed.  Patient opts for beginning screening today screening interval  4) Contraception - the patient is currently using  condoms.  She is happy with her current form of contraception and plans to continue We discussed safe sex practices to reduce her furture risk of STI's.    5) Breast tenderness: reduce caffeine intake  6) Return in about 1 year (around 06/28/2020) for annual established gyn.   Tresea Mall, CNM [facility] 06/29/2019, 9:06 AM

## 2019-07-03 LAB — CYTOLOGY - PAP
Chlamydia: NEGATIVE
Comment: NEGATIVE
Comment: NEGATIVE
Comment: NORMAL
Diagnosis: NEGATIVE
Diagnosis: REACTIVE
Neisseria Gonorrhea: NEGATIVE
Trichomonas: NEGATIVE

## 2020-06-03 IMAGING — DX DG CHEST 1V PORT
1 series · 1 of 1 positions shown · non-contrast
Comparison: None.

CLINICAL DATA: Dry cough and wheezing.

EXAM:
PORTABLE CHEST 1 VIEW

[chest ap]
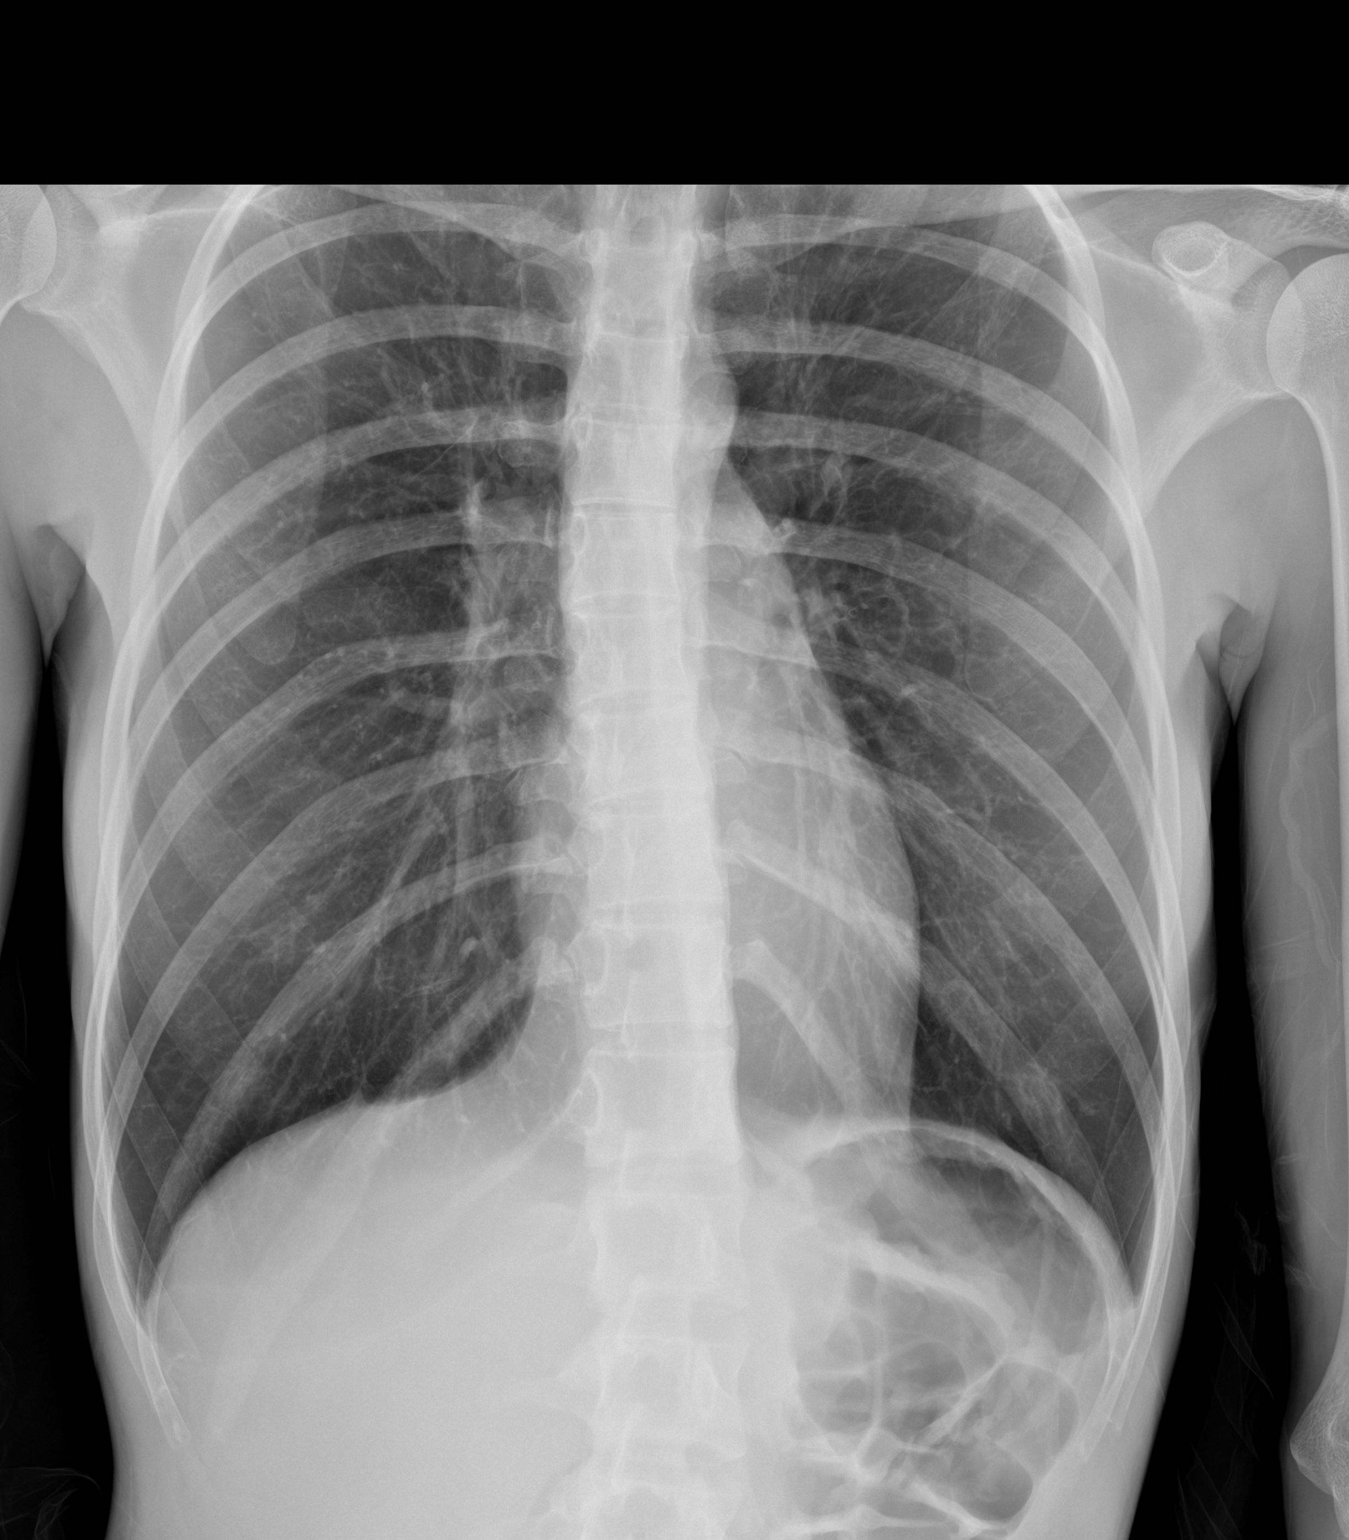

[1 of 1 positions shown; findings below may reference images not displayed]

FINDINGS: The cardiac silhouette, mediastinal and hilar contours are normal.
Mild hyperinflation is noted. No infiltrates, edema or effusions. No
pulmonary lesions. The bony thorax is intact.
IMPRESSION: Mild hyperinflation but no infiltrates or effusions.

## 2021-09-05 ENCOUNTER — Emergency Department
Admission: EM | Admit: 2021-09-05 | Discharge: 2021-09-05 | Disposition: A | Discharge status: Home / Self Care | Payer: No Typology Code available for payment source | Attending: Emergency Medicine | Admitting: Emergency Medicine

## 2021-09-05 ENCOUNTER — Other Ambulatory Visit: Payer: Self-pay

## 2021-09-05 ENCOUNTER — Emergency Department: Payer: No Typology Code available for payment source

## 2021-09-05 DIAGNOSIS — S060X0A Concussion without loss of consciousness, initial encounter: Secondary | ICD-10-CM | POA: Diagnosis not present

## 2021-09-05 DIAGNOSIS — Y92410 Unspecified street and highway as the place of occurrence of the external cause: Secondary | ICD-10-CM | POA: Diagnosis not present

## 2021-09-05 DIAGNOSIS — S161XXA Strain of muscle, fascia and tendon at neck level, initial encounter: Secondary | ICD-10-CM | POA: Diagnosis not present

## 2021-09-05 DIAGNOSIS — S0990XA Unspecified injury of head, initial encounter: Secondary | ICD-10-CM | POA: Diagnosis present

## 2021-09-05 HISTORY — DX: Unspecified asthma, uncomplicated: J45.909

## 2021-09-05 MED ORDER — IBUPROFEN 600 MG PO TABS
600.0000 mg | ORAL_TABLET | Freq: Once | ORAL | Status: AC
Start: 2021-09-05 — End: 2021-09-05
  Administered 2021-09-05: 600 mg via ORAL
  Filled 2021-09-05: qty 1

## 2021-09-05 MED ORDER — CYCLOBENZAPRINE HCL 10 MG PO TABS: 10.0000 mg | ORAL_TABLET | Freq: Three times a day (TID) | ORAL | 0 refills | Status: DC | PRN

## 2021-09-05 MED ORDER — IBUPROFEN 600 MG PO TABS: 600.0000 mg | ORAL_TABLET | Freq: Four times a day (QID) | ORAL | 0 refills | Status: DC | PRN
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
## 2021-10-12 ENCOUNTER — Ambulatory Visit: Payer: Medicaid Other | Admitting: Obstetrics & Gynecology

## 2022-01-12 ENCOUNTER — Encounter: Payer: Self-pay | Admitting: Advanced Practice Midwife

## 2022-01-12 ENCOUNTER — Ambulatory Visit: Payer: Medicaid Other | Admitting: Advanced Practice Midwife

## 2022-01-12 DIAGNOSIS — Z113 Encounter for screening for infections with a predominantly sexual mode of transmission: Secondary | ICD-10-CM | POA: Diagnosis not present

## 2022-01-12 DIAGNOSIS — F325 Major depressive disorder, single episode, in full remission: Secondary | ICD-10-CM | POA: Insufficient documentation

## 2022-01-12 DIAGNOSIS — Z720 Tobacco use: Secondary | ICD-10-CM

## 2022-01-12 DIAGNOSIS — B009 Herpesviral infection, unspecified: Secondary | ICD-10-CM | POA: Insufficient documentation

## 2022-01-12 DIAGNOSIS — J45909 Unspecified asthma, uncomplicated: Secondary | ICD-10-CM

## 2022-01-12 LAB — WET PREP FOR TRICH, YEAST, CLUE
Trichomonas Exam: NEGATIVE
Yeast Exam: NEGATIVE

## 2022-01-12 LAB — HM HIV SCREENING LAB: HM HIV Screening: NEGATIVE

## 2022-01-12 NOTE — Progress Notes (Signed)
[facility] Department  STI clinic/screening visit [place] [address] [phone]  Subjective:  Tamara MCEUEN is a 26 y.o. SWF G1P1 vaper female being seen today for an STI screening visit. The patient reports they do have symptoms.  Patient reports that they do not desire a pregnancy in the next year.   They reported they are not interested in discussing contraception today.    Patient's last menstrual period was 01/11/2022 (exact date).   Patient has the following medical conditions:   Patient Active Problem List   Diagnosis Date Noted   Herpes 01/12/2022   Vapes nicotine containing substance 01/12/2022   Asthma 01/12/2022   Depression/anxiety 01/12/2022    Chief Complaint  Patient presents with   STD Screen    HPI  Patient reports c/o external vaginal itching x 2 wks with malodor and increased clear-white d/c. Last sex 01/09/22 with condom; with current partner x 11 years; 1 partner in last 3 mo. LMP 01/11/22. Last cig 6 years ago. Current vaper. Last MJ yesterday. Last ETOH 01/07/22 (1 glass wine) 3x/year.   Does the patient using douching products? No  Last HIV test per patient/review of record was No results found for: "HMHIVSCREEN" No results found for: "HIV" Patient reports last pap was  Lab Results  Component Value Date   DIAGPAP  06/29/2019    - Negative for Intraepithelial Lesions or Malignancy (NILM)   DIAGPAP - Benign reactive/reparative changes 06/29/2019     Screening for MPX risk: Does the patient have an unexplained rash? No Is the patient MSM? No Does the patient endorse multiple sex partners or anonymous sex partners? No Did the patient have close or sexual contact with a person diagnosed with MPX? No Has the patient traveled outside the [place] where MPX is endemic? No Is there a high clinical suspicion for MPX-- evidenced by one of the following No  -Unlikely to be chickenpox  -Lymphadenopathy  -Rash that  present in same phase of evolution on any given body part See flowsheet for further details and programmatic requirements.   Immunization history:  Immunization History  Administered Date(s) Administered   Hepatitis B, PED/ADOLESCENT [birth year]93, 11/06/1993, 03/09/1994   MMR 12/06/1994, 07/24/1998   Tdap 10/29/2010   Varicella 09/06/1994     The following portions of the patient's history were reviewed and updated as appropriate: allergies, current medications, past medical history, past social history, past surgical history and problem list.  Objective:  There were no vitals filed for this visit.  Physical Exam Vitals and nursing note reviewed.  Constitutional:      Appearance: Normal appearance. She is normal weight.  HENT:     Head: Normocephalic and atraumatic.     Mouth/Throat:     Mouth: Mucous membranes are moist.     Pharynx: Oropharynx is clear. No oropharyngeal exudate or posterior oropharyngeal erythema.  Eyes:     Conjunctiva/sclera: Conjunctivae normal.  Pulmonary:     Effort: Pulmonary effort is normal.  Abdominal:     General: Abdomen is flat.     Palpations: Abdomen is soft. There is no mass.     Tenderness: There is no abdominal tenderness. There is no rebound.     Comments: Soft without masses or tenderness, good tone  Genitourinary:    General: Normal vulva.     Exam position: Lithotomy position.     Pubic Area: No rash or pubic lice.      Labia:  Right: No rash or lesion.        Left: No rash or lesion.      Vagina: Bleeding (on menses and removed tampon immediately before exam, ph<4.5) present. No vaginal discharge, erythema or lesions.     Cervix: Normal.     Uterus: Normal.      Adnexa: Right adnexa normal and left adnexa normal.     Rectum: Normal.     Comments: pH = <4.5 Lymphadenopathy:     Head:     Right side of head: No preauricular or posterior auricular adenopathy.     Left side of head: No preauricular or posterior auricular  adenopathy.     Cervical: No cervical adenopathy.     Right cervical: No superficial, deep or posterior cervical adenopathy.    Left cervical: No superficial, deep or posterior cervical adenopathy.     Upper Body:     Right upper body: No supraclavicular, axillary or epitrochlear adenopathy.     Left upper body: No supraclavicular, axillary or epitrochlear adenopathy.     Lower Body: No right inguinal adenopathy. No left inguinal adenopathy.  Skin:    General: Skin is warm and dry.     Findings: No rash.  Neurological:     Mental Status: She is alert and oriented to person, place, and time.      Assessment and Plan:  MIKAYLEE ARSENEAU is a 26 y.o. female presenting to the [facility] Department for STI screening  1. Screening examination for venereal disease Treat wet mount per standing orders Immunization nurse consult  - WET PREP FOR [place], YEAST, CLUE - Syphilis Serology, [place] [facility] - HIV/HCV [place] [facility] - Chlamydia/Gonorrhea [place] [facility] - Gonococcus culture  2. Herpes   3. Vapes nicotine containing substance   4. Uncomplicated asthma, unspecified asthma severity, unspecified whether persistent   5. Depression/anxiety On Lexapro     No follow-ups on file.  No future appointments.  Herbie Saxon, CNM

## 2022-01-17 LAB — GONOCOCCUS CULTURE

## 2022-01-19 LAB — HM HEPATITIS C SCREENING LAB: HM Hepatitis Screen: NEGATIVE

## 2022-01-22 ENCOUNTER — Telehealth: Payer: Self-pay

## 2022-01-22 NOTE — Telephone Encounter (Signed)
Pc to pt to advise of test results collected on 01/12/22. HIV nonreactive and Hep C -Anti-HCV antibody-Reactive. Explained to pt that this indicates prior exposure to Hep C with clearance of infection. Advised pt that does not mean protected from repeat exposure .

## 2022-01-27 ENCOUNTER — Encounter: Payer: Self-pay | Admitting: Advanced Practice Midwife

## 2022-03-09 DIAGNOSIS — Z205 Contact with and (suspected) exposure to viral hepatitis: Secondary | ICD-10-CM | POA: Insufficient documentation

## 2023-02-26 ENCOUNTER — Emergency Department
Admission: EM | Admit: 2023-02-26 | Discharge: 2023-02-26 | Disposition: A | Discharge status: Home / Self Care | Payer: Medicaid Other | Attending: Emergency Medicine | Admitting: Emergency Medicine

## 2023-02-26 ENCOUNTER — Emergency Department: Payer: Medicaid Other

## 2023-02-26 ENCOUNTER — Other Ambulatory Visit: Payer: Self-pay

## 2023-02-26 DIAGNOSIS — S43014A Anterior dislocation of right humerus, initial encounter: Secondary | ICD-10-CM | POA: Diagnosis not present

## 2023-02-26 DIAGNOSIS — Y9323 Activity, snow (alpine) (downhill) skiing, snow boarding, sledding, tobogganing and snow tubing: Secondary | ICD-10-CM | POA: Insufficient documentation

## 2023-02-26 DIAGNOSIS — M25511 Pain in right shoulder: Secondary | ICD-10-CM | POA: Diagnosis present

## 2023-02-26 MED ORDER — TRAMADOL HCL 50 MG PO TABS: 50.0000 mg | ORAL_TABLET | Freq: Two times a day (BID) | ORAL | 0 refills | Status: AC | PRN

## 2023-02-26 MED ORDER — ONDANSETRON HCL 4 MG/2ML IJ SOLN
4.0000 mg | Freq: Once | INTRAMUSCULAR | Status: AC
Start: 2023-02-26 — End: 2023-02-26
  Administered 2023-02-26: 4 mg via INTRAVENOUS
  Filled 2023-02-26: qty 2

## 2023-02-26 MED ORDER — HYDROMORPHONE HCL 1 MG/ML IJ SOLN
1.0000 mg | Freq: Once | INTRAMUSCULAR | Status: AC
  Administered 2023-02-26: 1 mg via INTRAVENOUS
  Filled 2023-02-26: qty 1

## 2023-02-26 NOTE — Discharge Instructions (Signed)
 Please continue with sling at all times except for showering.  You may alternate Tylenol and ibuprofen as needed for pain.  You may use Norco for moderate to severe pain as needed call orthopedic office on Monday to schedule follow-up appointment.

## 2023-02-26 NOTE — ED Triage Notes (Addendum)
 Pt to ED via POV from [facility]. Pt reports was snowboarding and fell catching herself on her right shoulder. Pt reports right shoulder pain. Sensation and pulses intact. Unable to see [facility] imaging that confirmed dislocation.

## 2023-02-26 NOTE — ED Provider Triage Note (Signed)
 Emergency Medicine Provider Triage Evaluation Note  Tamara Walters , a 28 y.o. female  was evaluated in triage.  Pt complains of right shoulder pain after snowboarding incident today.  Physical Exam  BP 110/89   Pulse 63   Temp 98 F (36.7 C)   Resp 20   SpO2 98%  Gen:   Awake, no distress   Resp:  Normal effort  MSK:   Moves extremities without difficulty  Other:    Medical Decision Making  Medically screening exam initiated at 3:27 PM.  Appropriate orders placed.  JOANMARIE TSANG was informed that the remainder of the evaluation will be completed by another provider, this initial triage assessment does not replace that evaluation, and the importance of remaining in the ED until their evaluation is complete.     Herlinda Kirk NOVAK, FNP 02/26/23 1529

## 2023-02-26 NOTE — ED Provider Notes (Signed)
  EMERGENCY DEPARTMENT AT [facility] Provider Note   CSN: [id number] Arrival date & time: 02/26/23  1512     History  Chief Complaint  Patient presents with   Shoulder Injury    Tamara Walters is a 28 y.o. female.  Presents to the emergency department for evaluation of right shoulder injury.  Around 1:30 PM today she was standing on his leg going down a hill, fell and after she was falling she felt her shoulder dislocate.  No history of shoulder dislocations.  No numbness or tingling throughout the right upper extremity.  She denies any other injury to her body.  Pain is moderate throughout the right shoulder.  She was seen at [facility], underwent a glenohumeral lidocaine injection and they were hoping to be able to perform a reduction but patient did not get any pain relief from the glenohumeral injections so patient was recommended to go to the ER.  She denies any past medical history, she has not had any medical patients for this pain except for glenohumeral injection.  HPI     Home Medications Prior to Admission medications   Medication Sig Start Date End Date Taking? Authorizing Provider  traMADol  (ULTRAM ) 50 MG tablet Take 1 tablet (50 mg total) by mouth every 12 (twelve) hours as needed for severe pain (pain score 7-10). 02/26/23  Yes Charlene Debby BROCKS, PA-C  albuterol  (VENTOLIN  HFA) 108 (90 Base) MCG/ACT inhaler Inhale 2 puffs into the lungs every 6 (six) hours as needed for wheezing or shortness of breath.    [provider]  escitalopram (LEXAPRO) 5 MG tablet Take 5 mg by mouth daily.    [provider]  fluticasone (FLOVENT HFA) 44 MCG/ACT inhaler Inhale 2 puffs into the lungs 2 (two) times daily.    [provider]  montelukast (SINGULAIR) 10 MG tablet Take 10 mg by mouth at bedtime.    [provider]      Allergies    Acetaminophen     Review of Systems   Review of Systems  Physical Exam Updated Vital  Signs BP 115/74   Pulse 64   Temp 98 F (36.7 C)   Resp 19   LMP 02/06/2023   SpO2 97%  Physical Exam Constitutional:      Appearance: She is well-developed.  HENT:     Head: Normocephalic and atraumatic.  Eyes:     Conjunctiva/sclera: Conjunctivae normal.  Cardiovascular:     Rate and Rhythm: Normal rate.  Pulmonary:     Effort: Pulmonary effort is normal. No respiratory distress.  Musculoskeletal:        General: Normal range of motion.     Cervical back: Normal range of motion.     Comments: Sulcus sign to the right shoulder present.  Unable to actively externally rotate without severe pain.  She has 2+ radial pulse.  She is neuro vas intact right upper extremity.  No swelling or skin breakdown noted.  She is nontender along the clavicle, sternum.  No C-spine tenderness.  Skin:    General: Skin is warm.     Findings: No rash.  Neurological:     General: No focal deficit present.     Mental Status: She is alert and oriented to person, place, and time. Mental status is at baseline.  Psychiatric:        Behavior: Behavior normal.        Thought Content: Thought content normal.     ED Results / Procedures /  Treatments   Labs (all labs ordered are listed, but only abnormal results are displayed) Labs Reviewed - No data to display  EKG None  Radiology DG Shoulder Right Result Date: 02/26/2023 CLINICAL DATA:  Post reduction EXAM: RIGHT SHOULDER - 2+ VIEW COMPARISON:  02/26/2023 at 1535 hours FINDINGS: Interval reduction of the humeral head in the glenoid fossa. No fracture is seen. The joint spaces are preserved. Visualized soft tissues are within normal limits. Visualized right lung is clear. IMPRESSION: Interval reduction of the humeral head in the glenoid fossa. No fracture is seen. Electronically Signed   By: Pinkie Pebbles M.D.   On: 02/26/2023 19:51   DG Shoulder Right Result Date: 02/26/2023 CLINICAL DATA:  Right shoulder pain after fall while snowboarding EXAM:  RIGHT SHOULDER - 2+ VIEW COMPARISON:  None Available. FINDINGS: Anterior-inferior dislocation of the right glenohumeral joint. No fracture is identified. AC joint intact and unremarkable. No focal soft tissue abnormality. IMPRESSION: Anterior-inferior dislocation of the right glenohumeral joint. Electronically Signed   By: Mabel Converse D.O.   On: 02/26/2023 15:51    Procedures .Ortho Injury Treatment  Date/Time: 02/26/2023 7:49 PM  Performed by: Charlene Debby BROCKS, PA-C Authorized by: Charlene Debby BROCKS, PA-C   Consent:    Consent obtained:  Verbal   Consent given by:  PatientInjury location: shoulder Location details: right shoulder Injury type: dislocation Dislocation type: anterior Hill-Sachs deformity: no Chronicity: new Pre-procedure neurovascular assessment: neurovascularly intact Pre-procedure distal perfusion: normal Pre-procedure neurological function: normal Pre-procedure range of motion: reduced  Anesthesia: Local anesthesia used: no (used by [facility] prior to ED visit)  Patient sedated: NoManipulation performed: yes Reduction method: external rotation Reduction successful: yes X-ray confirmed reduction: yes Immobilization: sling Post-procedure distal perfusion: normal Post-procedure neurological function: normal Post-procedure range of motion: unchanged       Medications Ordered in ED Medications  HYDROmorphone  (DILAUDID ) injection 1 mg (1 mg Intravenous Given 02/26/23 1901)  ondansetron  (ZOFRAN ) injection 4 mg (4 mg Intravenous Given 02/26/23 1901)    ED Course/ Medical Decision Making/ A&P                                 Medical Decision Making Amount and/or Complexity of Data Reviewed Radiology: ordered.  Risk Prescription drug management.   28 year old female with right shoulder dislocation.  Patient underwent closed reduction in the emergency department.  Postreduction films show anatomic alignment of the glenohumeral joint with no fractures.   She is placed into a sling.  She is neuro vastly intact with no neurological deficits.  She is given tramadol  for moderate to severe pain but will use Tylenol  and ibuprofen  for mild to moderate pain.  She will call orthopedic office on Monday to schedule follow-up appointment.  She understands signs symptoms return to the ED for. Final Clinical Impression(s) / ED Diagnoses Final diagnoses:  Anterior shoulder dislocation, right, initial encounter    Rx / DC Orders ED Discharge Orders          Ordered    traMADol  (ULTRAM ) 50 MG tablet  Every 12 hours PRN        02/26/23 1959              Lethia Donlon C, PA-C 02/26/23 [place] Viviann Pastor, MD 02/26/23 2038
